# Patient Record
Sex: Male | Born: 1985 | Race: White | Hispanic: No | Marital: Single | Smoking: Current every day smoker
Health system: Southern US, Community
[De-identification: ages and names within clinical notes are randomized; demographics above are authoritative.]

## PROBLEM LIST (undated history)

## (undated) DIAGNOSIS — Z8619 Personal history of other infectious and parasitic diseases: Secondary | ICD-10-CM

## (undated) DIAGNOSIS — T7840XA Allergy, unspecified, initial encounter: Secondary | ICD-10-CM

## (undated) HISTORY — DX: Allergy, unspecified, initial encounter: T78.40XA

---

## 2011-08-24 DIAGNOSIS — X500XXA Overexertion from strenuous movement or load, initial encounter: Secondary | ICD-10-CM | POA: Insufficient documentation

## 2011-08-24 DIAGNOSIS — Y9269 Other specified industrial and construction area as the place of occurrence of the external cause: Secondary | ICD-10-CM | POA: Insufficient documentation

## 2011-08-24 DIAGNOSIS — S93409A Sprain of unspecified ligament of unspecified ankle, initial encounter: Secondary | ICD-10-CM | POA: Insufficient documentation

## 2011-08-25 ENCOUNTER — Encounter (HOSPITAL_COMMUNITY): Payer: Self-pay | Admitting: Adult Health

## 2011-08-25 ENCOUNTER — Emergency Department (HOSPITAL_COMMUNITY)
Admission: EM | Admit: 2011-08-25 | Discharge: 2011-08-25 | Disposition: A | Discharge status: Home / Self Care | Payer: Worker's Compensation | Attending: Emergency Medicine | Admitting: Emergency Medicine

## 2011-08-25 ENCOUNTER — Emergency Department (HOSPITAL_COMMUNITY): Payer: Worker's Compensation

## 2011-08-25 DIAGNOSIS — S93409A Sprain of unspecified ligament of unspecified ankle, initial encounter: Secondary | ICD-10-CM

## 2011-08-25 HISTORY — DX: Personal history of other infectious and parasitic diseases: Z86.19

## 2011-08-25 NOTE — ED Provider Notes (Signed)
History     CSN: [id number]  Arrival date & time 08/24/11  2353   First MD Initiated Contact with Patient 08/25/11 0015      Chief Complaint  Patient presents with  . Ankle Pain   HPI  History provided by the patient. Patient is a 26 year old male with no significant PMH who presents with right ankle pain and injury. 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 Since that time he reports pain and swelling to the lateral aspect of the ankle. Pain is worse with movements and walking. Patient does report taking 3 acetaminophen. Pain is currently mild to moderate and rated as a 6/10. Patient denies any numbness or weakness to the toes or feet. He denies any other injury or complaint.    Past Medical History  Diagnosis Date  . History of shingles     History reviewed. No pertinent past surgical history.  History reviewed. No pertinent family history.  History  Substance Use Topics  . Smoking status: Never Smoker   . Smokeless tobacco: Not on file  . Alcohol Use: Yes      Review of Systems  Musculoskeletal:       Right ankle pain and swelling  Neurological: Negative for weakness and numbness.    Allergies  Review of patient's allergies indicates no known allergies.  Home Medications  No current outpatient prescriptions on file.  BP 108/60  Pulse 72  Temp 98.3 F (36.8 C) (Oral)  Resp 16  SpO2 99%  Physical Exam  Nursing note and vitals reviewed. Constitutional: He is oriented to person, place, and time. He appears well-developed and well-nourished. No distress.  HENT:  Head: Normocephalic.  Cardiovascular: Normal rate and regular rhythm.   Pulmonary/Chest: Effort normal and breath sounds normal.  Musculoskeletal:       Slightly reduced range of motion of right ankle secondary to pain. There is tenderness and swelling around the lateral malleolus. No gross  deformity. No proximal fifth metatarsal tenderness. No pain over the medial malleolus. No proximal fibular tenderness. Normal dorsal pedal pulses, movement in toes, sensation in toes and cap refill less than 2 seconds.  Neurological: He is alert and oriented to person, place, and time.  Skin: Skin is warm.  Psychiatric: He has a normal mood and affect. His behavior is normal.    ED Course  Procedures   Dg Ankle Complete Right  08/25/2011  *RADIOLOGY REPORT*  Clinical Data: Rolled foot and ankle and marked.  Pain on the lateral side.  RIGHT ANKLE - COMPLETE 3+ VIEW  Comparison: None.  Findings: There is soft tissue swelling along the lateral aspect of the ankle.  No evidence for acute fracture or dislocation.  Mortise is intact.  IMPRESSION: Soft tissue swelling. No evidence for acute osseous abnormality.  Original Report Authenticated By: Patterson Hammersmith, M.D.     1. Ankle sprain       MDM  Patient seen and evaluated. X-rays unremarkable. This time suspect ankle sprain. ASO and crutches provided. Patient offered pain medications but declined.        Angus Seller, PA 08/25/11 [phone]

## 2011-08-25 NOTE — ED Notes (Signed)
Patient returned from xray.

## 2011-08-25 NOTE — ED Notes (Signed)
While working pt misstepped and rolled right ankle. Right ankle edema, CMS intact.

## 2011-08-25 NOTE — ED Notes (Signed)
Patient over stepped his right ankle  Ankle swollen with good pulses

## 2011-08-26 NOTE — ED Provider Notes (Signed)
Medical screening examination/treatment/procedure(s) were performed by non-physician practitioner and as supervising physician I was immediately available for consultation/collaboration.    Reinhold Rickey D Lazette Estala, MD 08/26/11 1543 

## 2012-12-03 ENCOUNTER — Ambulatory Visit: Payer: 59 | Admitting: Physician Assistant

## 2012-12-03 VITALS — BP 102/60 | HR 74 | Temp 98.4°F | Resp 18 | Ht 72.5 in | Wt 165.0 lb

## 2012-12-03 DIAGNOSIS — K05219 Aggressive periodontitis, localized, unspecified severity: Secondary | ICD-10-CM

## 2012-12-03 MED ORDER — PENICILLIN V POTASSIUM 500 MG PO TABS: 500.0000 mg | ORAL_TABLET | Freq: Four times a day (QID) | ORAL | Status: AC

## 2012-12-03 NOTE — Patient Instructions (Signed)
Begin taking the antibiotic as directed.  Use Tylenol or Advil as needed for pain relief.  Make a dentist appointment as soon as possible.  Let us know if anything is worsening prior to seeing the dentist   [facility] [phone]  Rocco Pauls DDS [phone]   Dental Abscess A dental abscess is a collection of infected fluid (pus) from a bacterial infection in the inner part of the tooth (pulp). It usually occurs at the end of the tooth's root.  CAUSES   Severe tooth decay.  Trauma to the tooth that allows bacteria to enter into the pulp, such as a broken or chipped tooth. SYMPTOMS   Severe pain in and around the infected tooth.  Swelling and redness around the abscessed tooth or in the mouth or face.  Tenderness.  Pus drainage.  Bad breath.  Bitter taste in the mouth.  Difficulty swallowing.  Difficulty opening the mouth.  Nausea.  Vomiting.  Chills.  Swollen neck glands. DIAGNOSIS   A medical and dental history will be taken.  An examination will be performed by tapping on the abscessed tooth.  X-rays may be taken of the tooth to identify the abscess. TREATMENT The goal of treatment is to eliminate the infection. You may be prescribed antibiotic medicine to stop the infection from spreading. A root canal may be performed to save the tooth. If the tooth cannot be saved, it may be pulled (extracted) and the abscess may be drained.  HOME CARE INSTRUCTIONS  Only take over-the-counter or prescription medicines for pain, fever, or discomfort as directed by your caregiver.  Rinse your mouth (gargle) often with salt water ( tsp salt in 8 oz [250 ml] of warm water) to relieve pain or swelling.  Do not drive after taking pain medicine (narcotics).  Do not apply heat to the outside of your face.  Return to your dentist for further treatment as directed. SEEK MEDICAL CARE IF:  Your pain is not helped by medicine.  Your pain is getting worse  instead of better. SEEK IMMEDIATE MEDICAL CARE IF:  You have a fever or persistent symptoms for more than 2 3 days.  You have a fever and your symptoms suddenly get worse.  You have chills or a very bad headache.  You have problems breathing or swallowing.  You have trouble opening your mouth.  You have swelling in the neck or around the eye. Document Released: 01/09/2005 Document Revised: 10/04/2011 Document Reviewed: 04/19/2010 [facility] Patient Information 2014 [place], [place].

## 2012-12-03 NOTE — Progress Notes (Signed)
  Subjective:    Patient ID: David Cameron, male    DOB: December 26, 1985, 27 y.o.   MRN: [id number]  HPI   David Cameron is a very pleasant 27 yr old male here with concern for infection of his left lower gum.  Reports he got a piece of food stuck in his lower gum at dinner 4-5 days ago.  Has continued to have irritation, swelling in this area.  Also has some pain with eating.  Pain in the tooth with chewing.  No drainage from the area.  No fever.  He does not have a dentist.  Was trying to see a dentist today but had some difficulty with insurance.  Plans to get in with a dentist tomorrow.   Review of Systems  Constitutional: Negative for fever and chills.  HENT: Positive for dental problem.   Respiratory: Negative.   Cardiovascular: Negative.   Gastrointestinal: Negative.   Musculoskeletal: Negative.   Neurological: Negative.        Objective:   Physical Exam  Vitals reviewed. Constitutional: He is oriented to person, place, and time. He appears well-developed and well-nourished. No distress.  HENT:  Head: Normocephalic and atraumatic.  Mouth/Throat: Uvula is midline, oropharynx is clear and moist and mucous membranes are normal. Dental abscesses present.    Small area of erythema and fluctuance at lingual aspect of left lower gum; tender to palpation; no drainage; no obvious dental caries  Neck: Neck supple.  Cardiovascular: Normal rate, regular rhythm and normal heart sounds.   Pulmonary/Chest: Effort normal and breath sounds normal. He has no wheezes. He has no rales.  Lymphadenopathy:       Head (left side): Submandibular adenopathy present.    He has cervical adenopathy.       Left cervical: Superficial cervical adenopathy present.  Neurological: He is alert and oriented to person, place, and time.  Skin: Skin is warm and dry.  Psychiatric: He has a normal mood and affect. His behavior is normal.        Assessment & Plan:  Periodontal abscess - Plan: penicillin v potassium  (VEETID) 500 MG tablet   David Cameron is a very pleasant 27 yr old male with dental abscess.  He is afebrile, VSS.  Start pen VK QID.  Tylenol/Advil for pain relief.  Schedule dentist appt tomorrow - gave pt names/numbers.  RTC if worsening prior to dental eval.  Meds ordered this encounter  Medications  . penicillin v potassium (VEETID) 500 MG tablet    Sig: Take 1 tablet (500 mg total) by mouth 4 (four) times daily.    Dispense:  28 tablet    Refill:  0    Order Specific Question:  Supervising Provider    Answer:  Nilda Simmer M [[id number]]    Loleta Dicker MHS, PA-C [facility] 11/11/201410:03 PM

## 2013-05-20 ENCOUNTER — Other Ambulatory Visit: Payer: Self-pay | Admitting: Physician Assistant

## 2013-05-20 DIAGNOSIS — R1310 Dysphagia, unspecified: Secondary | ICD-10-CM

## 2013-05-27 ENCOUNTER — Ambulatory Visit
Admission: RE | Admit: 2013-05-27 | Discharge: 2013-05-27 | Disposition: A | Discharge status: Home / Self Care | Payer: 59 | Source: Ambulatory Visit | Attending: Physician Assistant | Admitting: Physician Assistant

## 2013-05-27 ENCOUNTER — Encounter (INDEPENDENT_AMBULATORY_CARE_PROVIDER_SITE_OTHER): Payer: Self-pay

## 2013-05-27 DIAGNOSIS — R1310 Dysphagia, unspecified: Secondary | ICD-10-CM

## 2015-01-11 IMAGING — RF DG UGI W/ HIGH DENSITY W/KUB
19 of 24 series · 19 of 24 positions shown · non-contrast
Comparison: None.

CLINICAL DATA: Dysphagia.  Globus feeling.

EXAM:
UPPER GI SERIES WITH KUB
TECHNIQUE: After obtaining a scout radiograph a routine upper GI series was
performed using thin and high density barium.
FLUOROSCOPY TIME:  2 min and 30 seconds

[Series 1: run · 1 of 1 slices shown (1 of 19)]
[im 1/1]
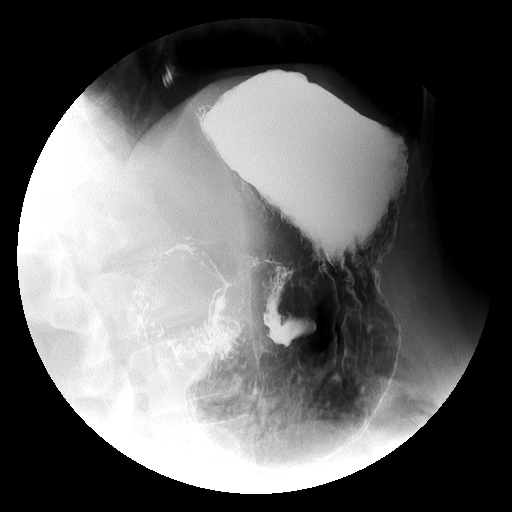

[Series 2: run · 1 of 1 slices shown (2 of 19)]
[im 1/1]
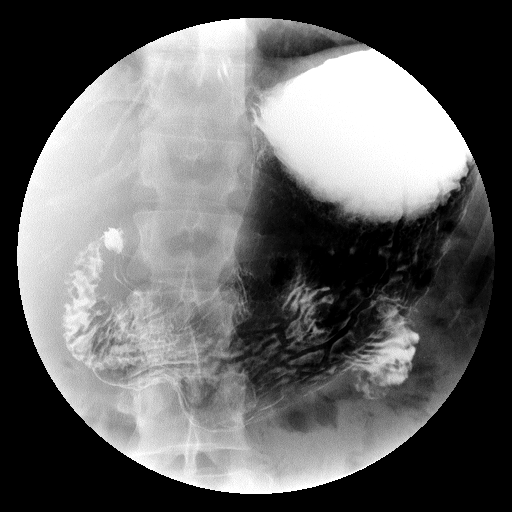

[Series 4: run · 1 of 1 slices shown (3 of 19)]
[im 1/1]
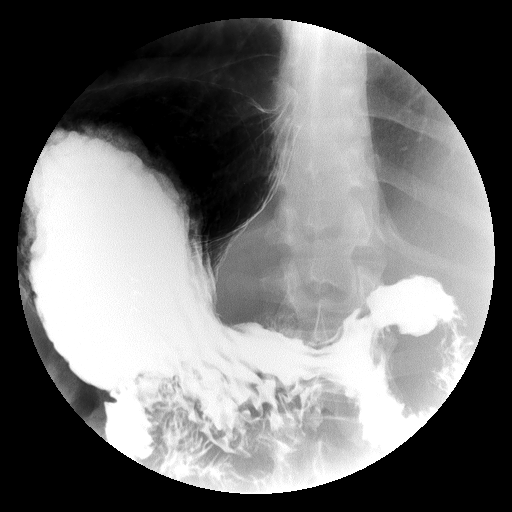

[Series 5: run · 1 of 1 slices shown (4 of 19)]
[im 1/1]
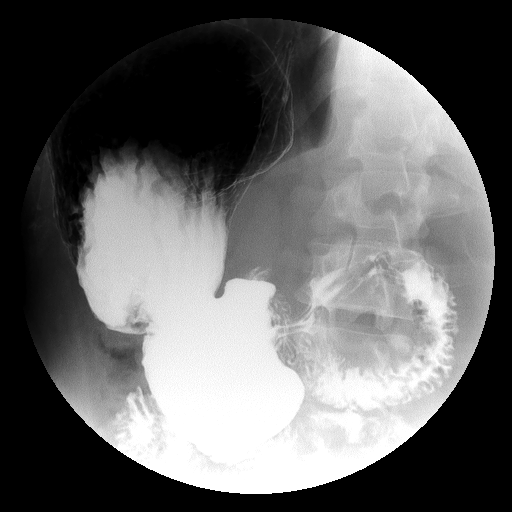

[Series 6: run · 1 of 1 slices shown (5 of 19)]
[im 1/1]
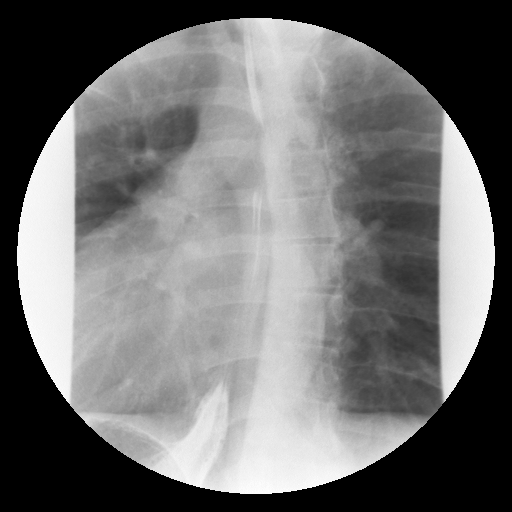

[Series 7: run · 1 of 1 slices shown (6 of 19)]
[im 1/1]
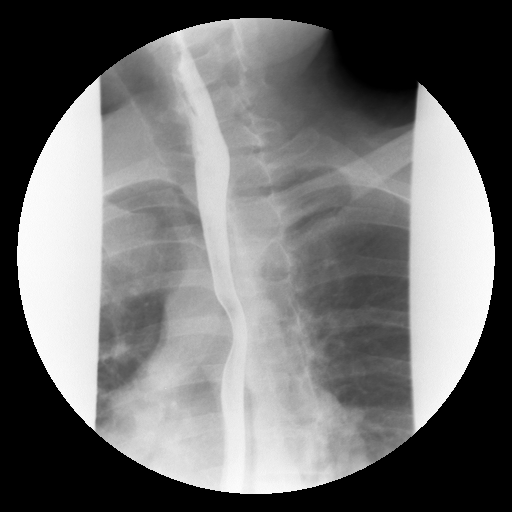

[Series 9: run · 1 of 1 slices shown (7 of 19)]
[im 1/1]
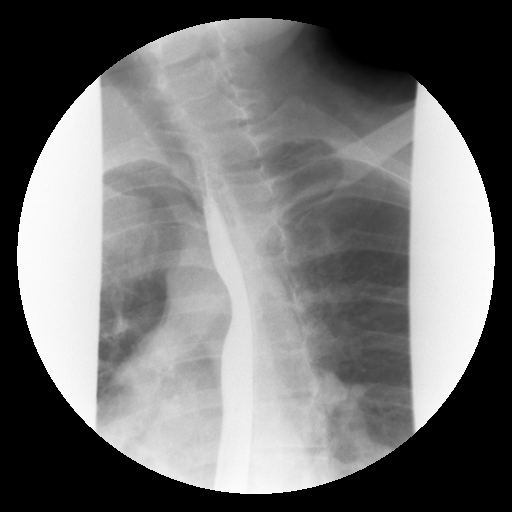

[Series 10: run · 1 of 1 slices shown (8 of 19)]
[im 1/1]
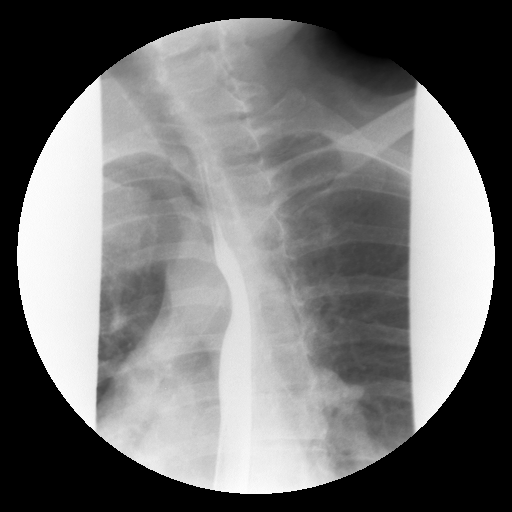

[Series 11: run · 1 of 1 slices shown (9 of 19)]
[im 1/1]
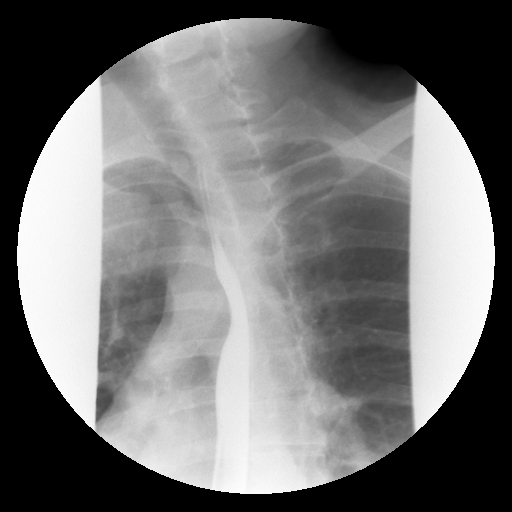

[Series 13: run · 1 of 1 slices shown (10 of 19)]
[im 1/1]
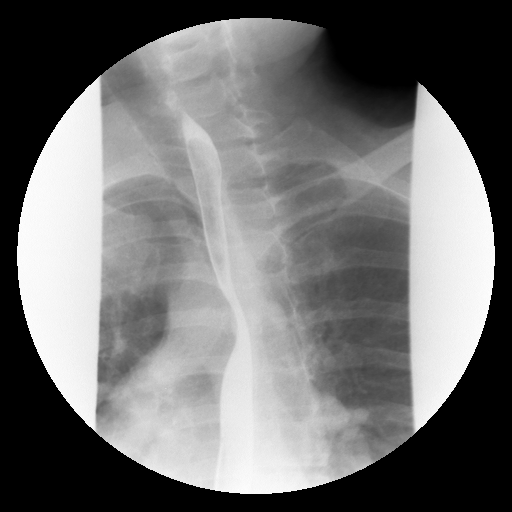

[Series 14: run · 1 of 1 slices shown (11 of 19)]
[im 1/1]
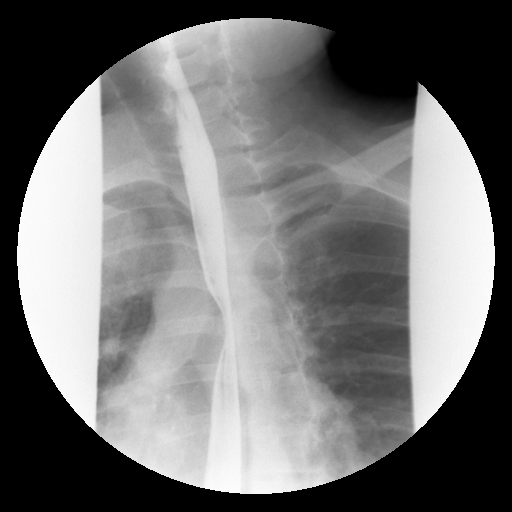

[Series 15: run · 1 of 1 slices shown (12 of 19)]
[im 1/1]
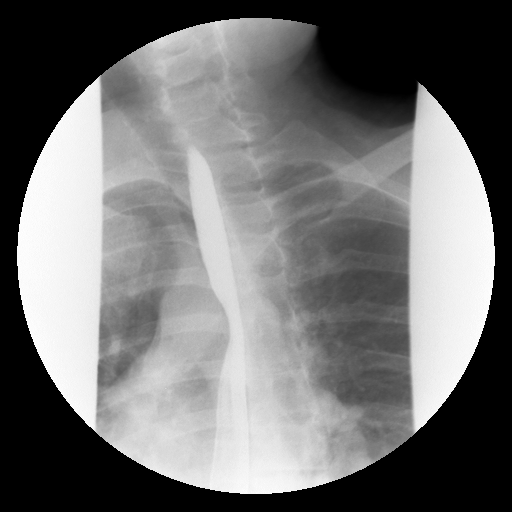

[Series 16: run · 1 of 1 slices shown (13 of 19)]
[im 1/1]
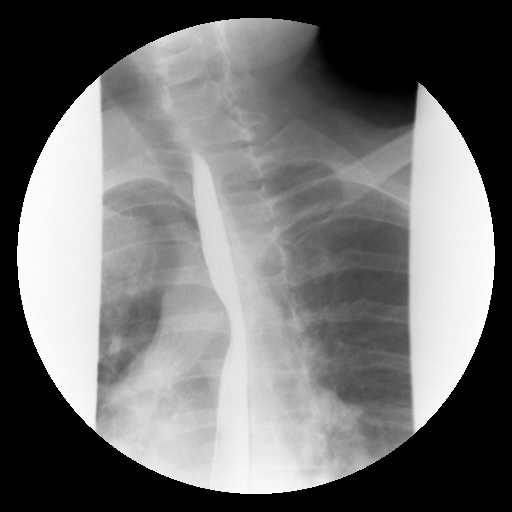

[Series 18: run · 1 of 1 slices shown (14 of 19)]
[im 1/1]
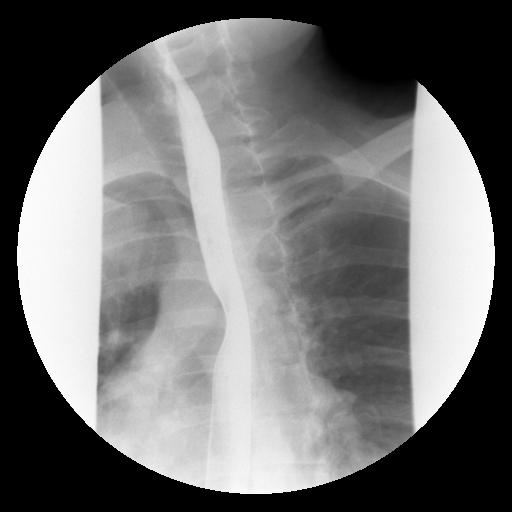

[Series 19: run · 1 of 1 slices shown (15 of 19)]
[im 1/1]
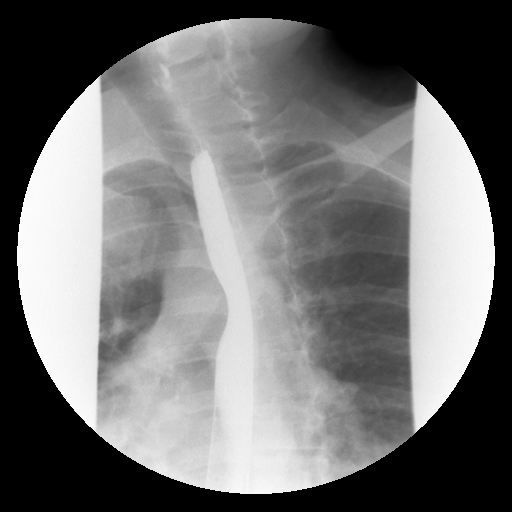

[Series 20: run · 1 of 1 slices shown (16 of 19)]
[im 1/1]
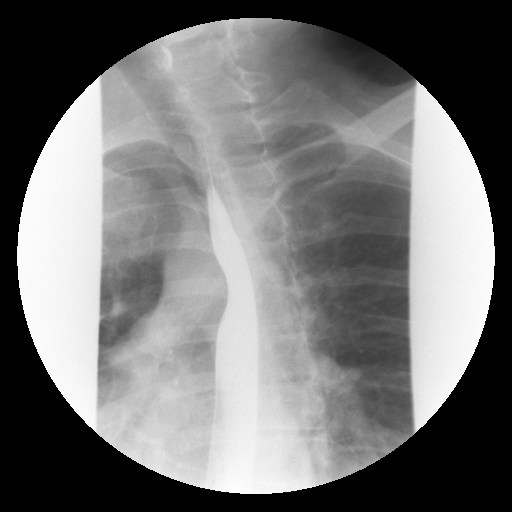

[Series 21: run · 1 of 1 slices shown (17 of 19)]
[im 1/1]
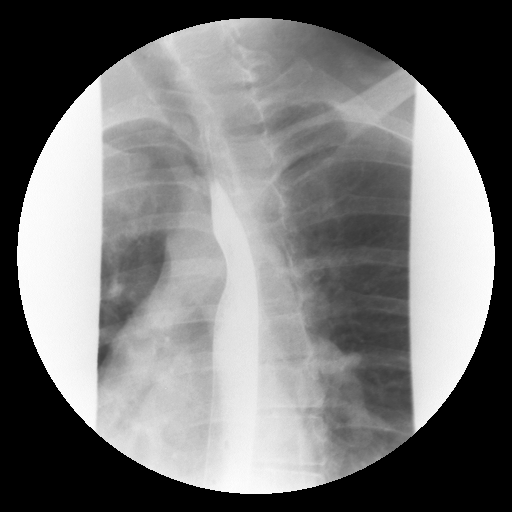

[Series 23: run · 1 of 1 slices shown (18 of 19)]
[im 1/1]
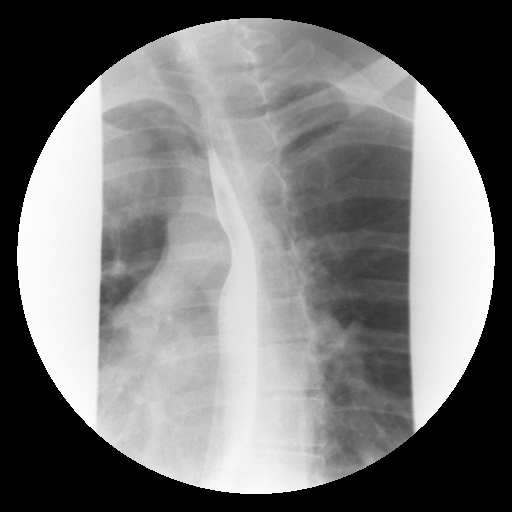

[Series 24: run · 1 of 1 slices shown (19 of 19)]
[im 1/1]
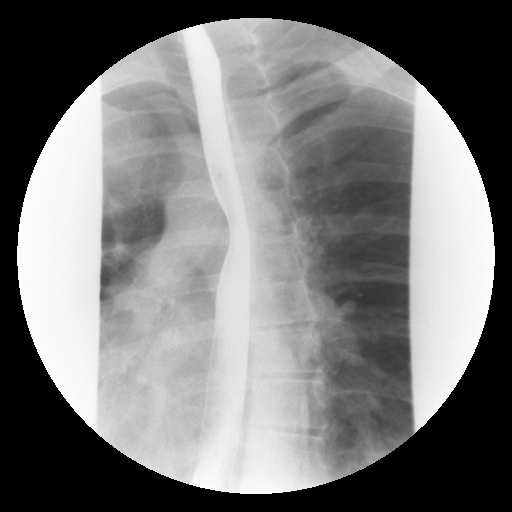

[19 of 24 positions shown; findings below may reference images not displayed]

FINDINGS: Preprocedure scout film demonstrates a nonobstructive bowel gas
pattern. Mild convex right lumbar spine curvature.

Double contrast evaluation of the stomach demonstrates no mass or
ulcer. Normal duodenal bulb and C-loop.

Evaluation of primary peristalsis demonstrates a normal primary
peristaltic wave on each swallow. Full column evaluation of the
esophagus demonstrates no persistent narrowing or stricture.

A 13 millimeter barium tablet passes promptly.

Hypopharyngeal portion of exam is unremarkable.
IMPRESSION: Normal upper GI.
